# Patient Record
Sex: Female | Born: 1980
Health system: Southern US, Community
[De-identification: ages and names within clinical notes are randomized; demographics above are authoritative.]

## PROBLEM LIST (undated history)

## (undated) DIAGNOSIS — J45909 Unspecified asthma, uncomplicated: Secondary | ICD-10-CM

## (undated) DIAGNOSIS — I10 Essential (primary) hypertension: Secondary | ICD-10-CM

## (undated) DIAGNOSIS — E119 Type 2 diabetes mellitus without complications: Secondary | ICD-10-CM

## (undated) HISTORY — PX: TONSILLECTOMY: SUR1361

## (undated) HISTORY — PX: BARTHOLIN CYST MARSUPIALIZATION: SHX5383

---
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
## 2010-07-18 ENCOUNTER — Emergency Department (HOSPITAL_COMMUNITY): Payer: BC Managed Care – PPO

## 2010-07-18 ENCOUNTER — Emergency Department (HOSPITAL_COMMUNITY)
Admission: EM | Admit: 2010-07-18 | Discharge: 2010-07-18 | Disposition: A | Discharge status: Home / Self Care | Payer: BC Managed Care – PPO | Attending: Emergency Medicine | Admitting: Emergency Medicine

## 2010-07-18 DIAGNOSIS — E119 Type 2 diabetes mellitus without complications: Secondary | ICD-10-CM | POA: Insufficient documentation

## 2010-07-18 DIAGNOSIS — D802 Selective deficiency of immunoglobulin A [IgA]: Secondary | ICD-10-CM | POA: Insufficient documentation

## 2010-07-18 DIAGNOSIS — I10 Essential (primary) hypertension: Secondary | ICD-10-CM | POA: Insufficient documentation

## 2010-07-18 DIAGNOSIS — R112 Nausea with vomiting, unspecified: Secondary | ICD-10-CM | POA: Insufficient documentation

## 2010-07-18 DIAGNOSIS — R1013 Epigastric pain: Secondary | ICD-10-CM | POA: Insufficient documentation

## 2010-07-18 DIAGNOSIS — R5383 Other fatigue: Secondary | ICD-10-CM | POA: Insufficient documentation

## 2010-07-18 DIAGNOSIS — R000 Tachycardia, unspecified: Secondary | ICD-10-CM | POA: Insufficient documentation

## 2010-07-18 DIAGNOSIS — R5381 Other malaise: Secondary | ICD-10-CM | POA: Insufficient documentation

## 2010-07-18 DIAGNOSIS — Z79899 Other long term (current) drug therapy: Secondary | ICD-10-CM | POA: Insufficient documentation

## 2010-07-18 LAB — COMPREHENSIVE METABOLIC PANEL
ALT: 17 U/L (ref 0–35)
Alkaline Phosphatase: 46 U/L (ref 39–117)
CO2: 24 mEq/L (ref 19–32)
Chloride: 100 mEq/L (ref 96–112)
Glucose, Bld: 182 mg/dL — ABNORMAL HIGH (ref 70–99)
Potassium: 3.5 mEq/L (ref 3.5–5.1)
Sodium: 136 mEq/L (ref 135–145)
Total Bilirubin: 0.3 mg/dL (ref 0.3–1.2)
Total Protein: 7.1 g/dL (ref 6.0–8.3)

## 2010-07-18 LAB — URINALYSIS, ROUTINE W REFLEX MICROSCOPIC
Bilirubin Urine: NEGATIVE
Glucose, UA: NEGATIVE mg/dL
Hgb urine dipstick: NEGATIVE
Ketones, ur: 15 mg/dL — AB
Nitrite: NEGATIVE
Protein, ur: NEGATIVE mg/dL
Specific Gravity, Urine: 1.028 (ref 1.005–1.030)
Urobilinogen, UA: 0.2 mg/dL (ref 0.0–1.0)
pH: 5.5 (ref 5.0–8.0)

## 2010-07-18 LAB — DIFFERENTIAL
Basophils Absolute: 0 K/uL (ref 0.0–0.1)
Basophils Relative: 0 % (ref 0–1)
Eosinophils Absolute: 0 K/uL (ref 0.0–0.7)
Eosinophils Relative: 0 % (ref 0–5)
Lymphocytes Relative: 9 % — ABNORMAL LOW (ref 12–46)
Lymphs Abs: 1.1 10*3/uL (ref 0.7–4.0)
Monocytes Absolute: 0.5 10*3/uL (ref 0.1–1.0)
Monocytes Relative: 4 % (ref 3–12)
Neutro Abs: 10.3 10*3/uL — ABNORMAL HIGH (ref 1.7–7.7)
Neutrophils Relative %: 87 % — ABNORMAL HIGH (ref 43–77)

## 2010-07-18 LAB — COMPREHENSIVE METABOLIC PANEL WITH GFR
AST: 21 U/L (ref 0–37)
Albumin: 3.6 g/dL (ref 3.5–5.2)
BUN: 13 mg/dL (ref 6–23)
Calcium: 8.7 mg/dL (ref 8.4–10.5)
Creatinine, Ser: <0.47 mg/dL (ref 0.4–1.2)

## 2010-07-18 LAB — CBC
HCT: 38.2 % (ref 36.0–46.0)
Hemoglobin: 13.2 g/dL (ref 12.0–15.0)
MCH: 30.8 pg (ref 26.0–34.0)
MCHC: 34.6 g/dL (ref 30.0–36.0)
MCV: 89 fL (ref 78.0–100.0)
Platelets: 332 K/uL (ref 150–400)
RBC: 4.29 MIL/uL (ref 3.87–5.11)
RDW: 12.3 % (ref 11.5–15.5)
WBC: 11.8 K/uL — ABNORMAL HIGH (ref 4.0–10.5)

## 2010-07-18 LAB — LIPASE, BLOOD: Lipase: 36 U/L (ref 11–59)

## 2010-07-18 LAB — PREGNANCY, URINE: Preg Test, Ur: NEGATIVE

## 2013-09-30 ENCOUNTER — Other Ambulatory Visit (HOSPITAL_COMMUNITY): Payer: Self-pay | Admitting: Family Medicine

## 2013-09-30 DIAGNOSIS — R12 Heartburn: Secondary | ICD-10-CM

## 2013-09-30 DIAGNOSIS — R110 Nausea: Secondary | ICD-10-CM

## 2013-10-07 ENCOUNTER — Encounter (HOSPITAL_COMMUNITY)
Admission: RE | Admit: 2013-10-07 | Discharge: 2013-10-07 | Disposition: A | Discharge status: Home / Self Care | Payer: 59 | Source: Ambulatory Visit | Attending: Family Medicine | Admitting: Family Medicine

## 2013-10-07 DIAGNOSIS — R12 Heartburn: Secondary | ICD-10-CM

## 2013-10-07 DIAGNOSIS — R110 Nausea: Secondary | ICD-10-CM

## 2013-10-07 MED ORDER — SINCALIDE 5 MCG IJ SOLR
INTRAMUSCULAR | Status: AC
  Administered 2013-10-07: 5.68 ug via INTRAVENOUS
  Filled 2013-10-07: qty 10

## 2013-10-07 MED ORDER — SINCALIDE 5 MCG IJ SOLR
0.0200 ug/kg | Freq: Once | INTRAMUSCULAR | Status: AC
  Administered 2013-10-07: 5.68 ug via INTRAVENOUS

## 2013-10-07 MED ORDER — TECHNETIUM TC 99M MEBROFENIN IV KIT
5.0000 | PACK | Freq: Once | INTRAVENOUS | Status: AC | PRN
  Administered 2013-10-07: 5 via INTRAVENOUS

## 2014-03-29 ENCOUNTER — Ambulatory Visit: Payer: Self-pay | Admitting: Emergency Medicine

## 2014-03-29 LAB — RAPID STREP-A WITH REFLX: Micro Text Report: NEGATIVE

## 2014-04-01 LAB — BETA STREP CULTURE(ARMC)

## 2014-05-11 ENCOUNTER — Encounter (HOSPITAL_COMMUNITY): Payer: Self-pay

## 2014-05-11 ENCOUNTER — Encounter (HOSPITAL_COMMUNITY)
Admission: RE | Admit: 2014-05-11 | Discharge: 2014-05-11 | Disposition: A | Discharge status: Home / Self Care | Payer: 59 | Source: Ambulatory Visit | Attending: Obstetrics and Gynecology | Admitting: Obstetrics and Gynecology

## 2014-05-11 DIAGNOSIS — N809 Endometriosis, unspecified: Secondary | ICD-10-CM | POA: Insufficient documentation

## 2014-05-11 DIAGNOSIS — E282 Polycystic ovarian syndrome: Secondary | ICD-10-CM | POA: Diagnosis not present

## 2014-05-11 DIAGNOSIS — Z01818 Encounter for other preprocedural examination: Secondary | ICD-10-CM | POA: Diagnosis not present

## 2014-05-11 HISTORY — DX: Type 2 diabetes mellitus without complications: E11.9

## 2014-05-11 HISTORY — DX: Essential (primary) hypertension: I10

## 2014-05-11 HISTORY — DX: Unspecified asthma, uncomplicated: J45.909

## 2014-05-11 LAB — CBC
HEMATOCRIT: 37.7 % (ref 36.0–46.0)
HEMOGLOBIN: 13 g/dL (ref 12.0–15.0)
MCH: 31.2 pg (ref 26.0–34.0)
MCHC: 34.5 g/dL (ref 30.0–36.0)
MCV: 90.4 fL (ref 78.0–100.0)
Platelets: 328 10*3/uL (ref 150–400)
RBC: 4.17 MIL/uL (ref 3.87–5.11)
RDW: 12.9 % (ref 11.5–15.5)
WBC: 11.8 10*3/uL — AB (ref 4.0–10.5)

## 2014-05-11 LAB — BASIC METABOLIC PANEL
ANION GAP: 9 (ref 5–15)
BUN: 9 mg/dL (ref 6–23)
CALCIUM: 9.1 mg/dL (ref 8.4–10.5)
CHLORIDE: 99 mmol/L (ref 96–112)
CO2: 29 mmol/L (ref 19–32)
Creatinine, Ser: 0.31 mg/dL — ABNORMAL LOW (ref 0.50–1.10)
GFR calc Af Amer: >90 mL/min (ref >90)
GLUCOSE: 204 mg/dL — AB (ref 70–99)
POTASSIUM: 3.6 mmol/L (ref 3.5–5.1)
Sodium: 137 mmol/L (ref 135–145)

## 2014-05-11 NOTE — Patient Instructions (Addendum)
   Your procedure is scheduled on: MARCH 7 AT 730AM  Enter through the Main Entrance of [facility] at:6AM  Pick up the phone at the desk and dial [phone] and inform us of your arrival.  Please call this number if you have any problems the morning of surgery: [phone]  Remember: Do not eat food after midnight:MARCH 6 Do not drink clear liquids after: MARCH6 Take these medicines the morning of surgery with a SIP OF WATER: DO NOT TAKE DIABETES MEDS DAY OF SURGERY.. DO TAKE BLOOD PRESSURE DAY OF SURGERY.Marland Kitchen.TAKE PRIOLESC AM OF SURGERY  Do not wear jewelry, make-up, or FINGER nail polish No metal in your hair or on your body. Do not wear lotions, powders, perfumes.  You may wear deodorant.  Do not bring valuables to the hospital. Contacts, dentures or bridgework may not be worn into surgery.  Leave suitcase in the car. After Surgery it may be brought to your room. For patients being admitted to the hospital, checkout time is 11:00am the day of discharge.    Patients discharged on the day of surgery will not be allowed to drive home.

## 2014-05-12 NOTE — H&P (Signed)
  34 year old G 1 P 0 with chronic pelvic pain presents for TAH and BSO. Pain unrelieved with ocps, vesicare, antibiotics or physical therapy.  She has had a normal pelvic ultrasound except for PCOS. She complains of dyspareunia.    Past Medical History  Diagnosis Date  . Hypertension   . Diabetes mellitus without complication   . Asthma     HX BRONCHITIS   Past Surgical History  Procedure Laterality Date  . Bartholin cyst marsupialization    . Tonsillectomy     Review of patient's allergies indicates no known allergies.  There were no vitals taken for this visit. No results found for this or any previous visit (from the past 24 hour(s)).  History  Substance Use Topics  . Smoking status: Former Smoker    Quit date: 05/10/2009  . Smokeless tobacco: Never Used  . Alcohol Use: No   No family history on file. Afebrile VSS General alert and oriented Lung CTAB Car RRR Abdomen is soft and non tender Pelvic generalized pelvic pain tenderness. Tenderness with movement of uterus  IMPRESSION: Chronic pelvic pain  PLAN: TAH and BSO Risks reviewed Consent signed

## 2014-05-15 MED ORDER — CEFAZOLIN SODIUM 10 G IJ SOLR
3.0000 g | INTRAMUSCULAR | Status: AC
  Administered 2014-05-16: 3 g via INTRAVENOUS
  Filled 2014-05-15: qty 3000

## 2014-05-16 ENCOUNTER — Encounter (HOSPITAL_COMMUNITY): Payer: Self-pay | Admitting: *Deleted

## 2014-05-16 ENCOUNTER — Inpatient Hospital Stay (HOSPITAL_COMMUNITY): Payer: 59 | Admitting: Anesthesiology

## 2014-05-16 ENCOUNTER — Encounter (HOSPITAL_COMMUNITY)
Admission: RE | Disposition: A | Discharge status: Home / Self Care | Payer: Self-pay | Source: Ambulatory Visit | Attending: Obstetrics and Gynecology

## 2014-05-16 ENCOUNTER — Inpatient Hospital Stay (HOSPITAL_COMMUNITY)
Admission: RE | Admit: 2014-05-16 | Discharge: 2014-05-17 | DRG: 743 | Disposition: A | Discharge status: Home / Self Care | Payer: 59 | Source: Ambulatory Visit | Attending: Obstetrics and Gynecology | Admitting: Obstetrics and Gynecology

## 2014-05-16 DIAGNOSIS — G8929 Other chronic pain: Secondary | ICD-10-CM | POA: Diagnosis present

## 2014-05-16 DIAGNOSIS — I10 Essential (primary) hypertension: Secondary | ICD-10-CM | POA: Diagnosis present

## 2014-05-16 DIAGNOSIS — E282 Polycystic ovarian syndrome: Principal | ICD-10-CM | POA: Diagnosis present

## 2014-05-16 DIAGNOSIS — Z87891 Personal history of nicotine dependence: Secondary | ICD-10-CM | POA: Diagnosis not present

## 2014-05-16 DIAGNOSIS — J45909 Unspecified asthma, uncomplicated: Secondary | ICD-10-CM | POA: Diagnosis present

## 2014-05-16 DIAGNOSIS — N809 Endometriosis, unspecified: Secondary | ICD-10-CM | POA: Diagnosis present

## 2014-05-16 DIAGNOSIS — E119 Type 2 diabetes mellitus without complications: Secondary | ICD-10-CM | POA: Diagnosis present

## 2014-05-16 DIAGNOSIS — Z90710 Acquired absence of both cervix and uterus: Secondary | ICD-10-CM

## 2014-05-16 DIAGNOSIS — Z90722 Acquired absence of ovaries, bilateral: Secondary | ICD-10-CM

## 2014-05-16 DIAGNOSIS — R102 Pelvic and perineal pain: Secondary | ICD-10-CM | POA: Diagnosis present

## 2014-05-16 DIAGNOSIS — Z9079 Acquired absence of other genital organ(s): Secondary | ICD-10-CM

## 2014-05-16 HISTORY — PX: SALPINGOOPHORECTOMY: SHX82

## 2014-05-16 HISTORY — PX: ABDOMINAL HYSTERECTOMY: SHX81

## 2014-05-16 LAB — GLUCOSE, CAPILLARY
GLUCOSE-CAPILLARY: 234 mg/dL — AB (ref 70–99)
GLUCOSE-CAPILLARY: 272 mg/dL — AB (ref 70–99)
GLUCOSE-CAPILLARY: 283 mg/dL — AB (ref 70–99)
Glucose-Capillary: 282 mg/dL — ABNORMAL HIGH (ref 70–99)
Glucose-Capillary: 244 mg/dL — ABNORMAL HIGH (ref 70–99)
Glucose-Capillary: 184 mg/dL — ABNORMAL HIGH (ref 70–99)

## 2014-05-16 LAB — PREGNANCY, URINE: Preg Test, Ur: NEGATIVE

## 2014-05-16 SURGERY — HYSTERECTOMY, ABDOMINAL
Anesthesia: General | Site: Abdomen

## 2014-05-16 MED ORDER — MIDAZOLAM HCL 2 MG/2ML IJ SOLN
INTRAMUSCULAR | Status: AC
  Filled 2014-05-16: qty 2

## 2014-05-16 MED ORDER — ONDANSETRON HCL 4 MG/2ML IJ SOLN: 4.0000 mg | Freq: Four times a day (QID) | INTRAMUSCULAR | Status: DC | PRN

## 2014-05-16 MED ORDER — LACTATED RINGERS IV SOLN
INTRAVENOUS | Status: DC
  Administered 2014-05-16 (×2): via INTRAVENOUS

## 2014-05-16 MED ORDER — METFORMIN HCL ER 500 MG PO TB24
1000.0000 mg | ORAL_TABLET | Freq: Every day | ORAL | Status: DC
  Administered 2014-05-17: 1000 mg via ORAL
  Filled 2014-05-16 (×2): qty 2

## 2014-05-16 MED ORDER — IBUPROFEN 600 MG PO TABS
600.0000 mg | ORAL_TABLET | Freq: Four times a day (QID) | ORAL | Status: DC | PRN
  Administered 2014-05-17 (×2): 600 mg via ORAL
  Filled 2014-05-16 (×2): qty 1

## 2014-05-16 MED ORDER — LACTATED RINGERS IV SOLN: INTRAVENOUS | Status: DC

## 2014-05-16 MED ORDER — HYDROMORPHONE HCL 1 MG/ML IJ SOLN
INTRAMUSCULAR | Status: AC
  Administered 2014-05-16: 0.5 mg via INTRAVENOUS
  Filled 2014-05-16: qty 1

## 2014-05-16 MED ORDER — LISINOPRIL 10 MG PO TABS
10.0000 mg | ORAL_TABLET | Freq: Every day | ORAL | Status: DC
  Administered 2014-05-17: 10 mg via ORAL
  Filled 2014-05-16 (×2): qty 1

## 2014-05-16 MED ORDER — FLUTICASONE PROPIONATE 50 MCG/ACT NA SUSP
2.0000 | Freq: Every day | NASAL | Status: DC
  Administered 2014-05-16 – 2014-05-17 (×2): 2 via NASAL
  Filled 2014-05-16: qty 16

## 2014-05-16 MED ORDER — GLYCOPYRROLATE 0.2 MG/ML IJ SOLN
INTRAMUSCULAR | Status: DC | PRN
  Administered 2014-05-16: 0.2 mg via INTRAVENOUS
  Administered 2014-05-16: .8 mg via INTRAVENOUS

## 2014-05-16 MED ORDER — KETOROLAC TROMETHAMINE 30 MG/ML IJ SOLN
INTRAMUSCULAR | Status: AC
  Filled 2014-05-16: qty 1

## 2014-05-16 MED ORDER — HYOSCYAMINE SULFATE 0.125 MG SL SUBL
0.1250 mg | SUBLINGUAL_TABLET | Freq: Every day | SUBLINGUAL | Status: DC
  Administered 2014-05-16 – 2014-05-17 (×2): 0.125 mg via SUBLINGUAL
  Filled 2014-05-16 (×3): qty 1

## 2014-05-16 MED ORDER — FENTANYL CITRATE 0.05 MG/ML IJ SOLN
INTRAMUSCULAR | Status: DC | PRN
  Administered 2014-05-16: 100 ug via INTRAVENOUS
  Administered 2014-05-16: 150 ug via INTRAVENOUS
  Administered 2014-05-16: 100 ug via INTRAVENOUS

## 2014-05-16 MED ORDER — SITAGLIP PHOS-METFORMIN HCL ER 100-1000 MG PO TB24: 1.0000 | ORAL_TABLET | Freq: Every day | ORAL | Status: DC

## 2014-05-16 MED ORDER — SODIUM CHLORIDE 0.9 % IJ SOLN: 9.0000 mL | INTRAMUSCULAR | Status: DC | PRN

## 2014-05-16 MED ORDER — MENTHOL 3 MG MT LOZG: 1.0000 | LOZENGE | OROMUCOSAL | Status: DC | PRN

## 2014-05-16 MED ORDER — NEOSTIGMINE METHYLSULFATE 10 MG/10ML IV SOLN
INTRAVENOUS | Status: DC | PRN
  Administered 2014-05-16: 5 mg via INTRAVENOUS

## 2014-05-16 MED ORDER — PROMETHAZINE HCL 25 MG/ML IJ SOLN: 6.2500 mg | INTRAMUSCULAR | Status: DC | PRN

## 2014-05-16 MED ORDER — ACETAMINOPHEN 160 MG/5ML PO SOLN: 325.0000 mg | ORAL | Status: DC | PRN

## 2014-05-16 MED ORDER — BUPIVACAINE LIPOSOME 1.3 % IJ SUSP
20.0000 mL | Freq: Once | INTRAMUSCULAR | Status: DC
  Filled 2014-05-16: qty 20

## 2014-05-16 MED ORDER — NEOSTIGMINE METHYLSULFATE 10 MG/10ML IV SOLN
INTRAVENOUS | Status: AC
  Filled 2014-05-16: qty 1

## 2014-05-16 MED ORDER — LACTATED RINGERS IV SOLN
INTRAVENOUS | Status: DC
  Administered 2014-05-16 (×2): via INTRAVENOUS

## 2014-05-16 MED ORDER — HYDROMORPHONE HCL 1 MG/ML IJ SOLN
INTRAMUSCULAR | Status: DC | PRN
  Administered 2014-05-16 (×2): 1 mg via INTRAVENOUS

## 2014-05-16 MED ORDER — MEPERIDINE HCL 25 MG/ML IJ SOLN: 6.2500 mg | INTRAMUSCULAR | Status: DC | PRN

## 2014-05-16 MED ORDER — HYDROCHLOROTHIAZIDE 12.5 MG PO CAPS
12.5000 mg | ORAL_CAPSULE | Freq: Every day | ORAL | Status: DC
  Administered 2014-05-17: 12.5 mg via ORAL
  Filled 2014-05-16 (×2): qty 1

## 2014-05-16 MED ORDER — SCOPOLAMINE 1 MG/3DAYS TD PT72
MEDICATED_PATCH | TRANSDERMAL | Status: AC
  Administered 2014-05-16: 1.5 mg via TRANSDERMAL
  Filled 2014-05-16: qty 1

## 2014-05-16 MED ORDER — HYDROMORPHONE HCL 1 MG/ML IJ SOLN
0.2500 mg | INTRAMUSCULAR | Status: DC | PRN
  Administered 2014-05-16 (×2): 0.5 mg via INTRAVENOUS

## 2014-05-16 MED ORDER — ONDANSETRON HCL 4 MG/2ML IJ SOLN
INTRAMUSCULAR | Status: AC
  Filled 2014-05-16: qty 2

## 2014-05-16 MED ORDER — BUPIVACAINE HCL (PF) 0.25 % IJ SOLN
INTRAMUSCULAR | Status: AC
  Filled 2014-05-16: qty 30

## 2014-05-16 MED ORDER — HYDROMORPHONE 0.3 MG/ML IV SOLN
INTRAVENOUS | Status: DC
  Administered 2014-05-16: 5.33 mL via INTRAVENOUS
  Administered 2014-05-16: 3.59 mg via INTRAVENOUS
  Administered 2014-05-16: 12:00:00 via INTRAVENOUS
  Administered 2014-05-16: 4 mL via INTRAVENOUS
  Filled 2014-05-16: qty 25

## 2014-05-16 MED ORDER — ROCURONIUM BROMIDE 100 MG/10ML IV SOLN
INTRAVENOUS | Status: AC
  Filled 2014-05-16: qty 1

## 2014-05-16 MED ORDER — INSULIN ASPART 100 UNIT/ML ~~LOC~~ SOLN
0.0000 [IU] | Freq: Three times a day (TID) | SUBCUTANEOUS | Status: DC
  Administered 2014-05-16 – 2014-05-17 (×3): 7 [IU] via SUBCUTANEOUS

## 2014-05-16 MED ORDER — LIDOCAINE HCL (CARDIAC) 20 MG/ML IV SOLN
INTRAVENOUS | Status: DC | PRN
  Administered 2014-05-16: 100 mg via INTRAVENOUS

## 2014-05-16 MED ORDER — BUPIVACAINE HCL (PF) 0.25 % IJ SOLN
INTRAMUSCULAR | Status: DC | PRN
  Administered 2014-05-16: 20 mL

## 2014-05-16 MED ORDER — INSULIN ASPART 100 UNIT/ML ~~LOC~~ SOLN
1.0000 [IU] | Freq: Once | SUBCUTANEOUS | Status: AC
  Administered 2014-05-16: 1 [IU] via SUBCUTANEOUS

## 2014-05-16 MED ORDER — FENTANYL CITRATE 0.05 MG/ML IJ SOLN
INTRAMUSCULAR | Status: AC
  Filled 2014-05-16: qty 5

## 2014-05-16 MED ORDER — 0.9 % SODIUM CHLORIDE (POUR BTL) OPTIME
TOPICAL | Status: DC | PRN
  Administered 2014-05-16: 2000 mL

## 2014-05-16 MED ORDER — SCOPOLAMINE 1 MG/3DAYS TD PT72
1.0000 | MEDICATED_PATCH | Freq: Once | TRANSDERMAL | Status: DC
  Administered 2014-05-16: 1.5 mg via TRANSDERMAL

## 2014-05-16 MED ORDER — NALOXONE HCL 0.4 MG/ML IJ SOLN: 0.4000 mg | INTRAMUSCULAR | Status: DC | PRN

## 2014-05-16 MED ORDER — ROCURONIUM BROMIDE 100 MG/10ML IV SOLN
INTRAVENOUS | Status: DC | PRN
  Administered 2014-05-16 (×2): 10 mg via INTRAVENOUS
  Administered 2014-05-16: 50 mg via INTRAVENOUS

## 2014-05-16 MED ORDER — SODIUM CHLORIDE 0.9 % IJ SOLN
INTRAMUSCULAR | Status: AC
  Filled 2014-05-16: qty 50

## 2014-05-16 MED ORDER — SUCCINYLCHOLINE CHLORIDE 20 MG/ML IJ SOLN
INTRAMUSCULAR | Status: AC
  Filled 2014-05-16: qty 1

## 2014-05-16 MED ORDER — ONDANSETRON HCL 4 MG/2ML IJ SOLN
INTRAMUSCULAR | Status: DC | PRN
  Administered 2014-05-16: 4 mg via INTRAVENOUS

## 2014-05-16 MED ORDER — KETOROLAC TROMETHAMINE 30 MG/ML IJ SOLN
30.0000 mg | Freq: Once | INTRAMUSCULAR | Status: AC
  Administered 2014-05-16: 30 mg via INTRAVENOUS

## 2014-05-16 MED ORDER — DIPHENHYDRAMINE HCL 50 MG/ML IJ SOLN: 12.5000 mg | Freq: Four times a day (QID) | INTRAMUSCULAR | Status: DC | PRN

## 2014-05-16 MED ORDER — PANTOPRAZOLE SODIUM 40 MG PO TBEC
40.0000 mg | DELAYED_RELEASE_TABLET | Freq: Every day | ORAL | Status: DC
  Administered 2014-05-17: 40 mg via ORAL
  Filled 2014-05-16: qty 1

## 2014-05-16 MED ORDER — DEXAMETHASONE SODIUM PHOSPHATE 10 MG/ML IJ SOLN
INTRAMUSCULAR | Status: AC
  Filled 2014-05-16: qty 1

## 2014-05-16 MED ORDER — SUCCINYLCHOLINE CHLORIDE 20 MG/ML IJ SOLN
INTRAMUSCULAR | Status: DC | PRN
  Administered 2014-05-16: 120 mg via INTRAVENOUS

## 2014-05-16 MED ORDER — PROPOFOL 10 MG/ML IV BOLUS
INTRAVENOUS | Status: DC | PRN
  Administered 2014-05-16: 300 mg via INTRAVENOUS
  Administered 2014-05-16: 100 mg via INTRAVENOUS

## 2014-05-16 MED ORDER — PROPOFOL 10 MG/ML IV BOLUS
INTRAVENOUS | Status: AC
  Filled 2014-05-16: qty 20

## 2014-05-16 MED ORDER — KETOROLAC TROMETHAMINE 30 MG/ML IJ SOLN: 30.0000 mg | Freq: Once | INTRAMUSCULAR | Status: DC | PRN

## 2014-05-16 MED ORDER — HYDROMORPHONE HCL 1 MG/ML IJ SOLN
INTRAMUSCULAR | Status: AC
  Filled 2014-05-16: qty 1

## 2014-05-16 MED ORDER — TRAMADOL HCL 50 MG PO TABS
50.0000 mg | ORAL_TABLET | Freq: Four times a day (QID) | ORAL | Status: DC | PRN
  Administered 2014-05-16 – 2014-05-17 (×2): 50 mg via ORAL
  Filled 2014-05-16 (×2): qty 1

## 2014-05-16 MED ORDER — ACETAMINOPHEN 325 MG PO TABS: 325.0000 mg | ORAL_TABLET | ORAL | Status: DC | PRN

## 2014-05-16 MED ORDER — FENTANYL CITRATE 0.05 MG/ML IJ SOLN
INTRAMUSCULAR | Status: AC
  Filled 2014-05-16: qty 2

## 2014-05-16 MED ORDER — GLIPIZIDE ER 10 MG PO TB24
10.0000 mg | ORAL_TABLET | Freq: Every day | ORAL | Status: DC
  Administered 2014-05-17: 10 mg via ORAL
  Filled 2014-05-16 (×2): qty 1

## 2014-05-16 MED ORDER — LIDOCAINE HCL (CARDIAC) 20 MG/ML IV SOLN
INTRAVENOUS | Status: AC
  Filled 2014-05-16: qty 5

## 2014-05-16 MED ORDER — SERTRALINE HCL 50 MG PO TABS
50.0000 mg | ORAL_TABLET | Freq: Every day | ORAL | Status: DC
  Administered 2014-05-16 – 2014-05-17 (×2): 50 mg via ORAL
  Filled 2014-05-16 (×3): qty 1

## 2014-05-16 MED ORDER — BUDESONIDE-FORMOTEROL FUMARATE 80-4.5 MCG/ACT IN AERO: 2.0000 | INHALATION_SPRAY | RESPIRATORY_TRACT | Status: DC | PRN

## 2014-05-16 MED ORDER — INSULIN ASPART 100 UNIT/ML ~~LOC~~ SOLN
6.0000 [IU] | Freq: Three times a day (TID) | SUBCUTANEOUS | Status: DC
Start: 2014-05-16 — End: 2014-05-17
  Administered 2014-05-16 – 2014-05-17 (×3): 6 [IU] via SUBCUTANEOUS

## 2014-05-16 MED ORDER — DIPHENHYDRAMINE HCL 12.5 MG/5ML PO ELIX: 12.5000 mg | ORAL_SOLUTION | Freq: Four times a day (QID) | ORAL | Status: DC | PRN

## 2014-05-16 MED ORDER — LISINOPRIL-HYDROCHLOROTHIAZIDE 10-12.5 MG PO TABS: 1.0000 | ORAL_TABLET | Freq: Every day | ORAL | Status: DC

## 2014-05-16 MED ORDER — MIDAZOLAM HCL 2 MG/2ML IJ SOLN: 0.5000 mg | Freq: Once | INTRAMUSCULAR | Status: DC | PRN

## 2014-05-16 MED ORDER — LINAGLIPTIN 5 MG PO TABS
5.0000 mg | ORAL_TABLET | Freq: Every day | ORAL | Status: DC
  Administered 2014-05-17: 5 mg via ORAL
  Filled 2014-05-16 (×2): qty 1

## 2014-05-16 SURGICAL SUPPLY — 41 items
CANISTER SUCT 3000ML (MISCELLANEOUS) ×4 IMPLANT
CLOTH BEACON ORANGE TIMEOUT ST (SAFETY) ×4 IMPLANT
DECANTER SPIKE VIAL GLASS SM (MISCELLANEOUS) IMPLANT
DRAPE CESAREAN BIRTH W POUCH (DRAPES) ×4 IMPLANT
DRAPE WARM FLUID 44X44 (DRAPE) ×4 IMPLANT
DRSG OPSITE POSTOP 4X10 (GAUZE/BANDAGES/DRESSINGS) ×4 IMPLANT
DURAPREP 26ML APPLICATOR (WOUND CARE) ×4 IMPLANT
ELECT BLADE 6.5 EXT (BLADE) ×4 IMPLANT
GAUZE SPONGE 4X4 16PLY XRAY LF (GAUZE/BANDAGES/DRESSINGS) IMPLANT
GLOVE BIO SURGEON STRL SZ 6.5 (GLOVE) ×3 IMPLANT
GLOVE BIO SURGEONS STRL SZ 6.5 (GLOVE) ×1
GLOVE BIOGEL PI IND STRL 7.0 (GLOVE) ×6 IMPLANT
GLOVE BIOGEL PI INDICATOR 7.0 (GLOVE) ×6
GOWN STRL REUS W/TWL LRG LVL3 (GOWN DISPOSABLE) ×12 IMPLANT
HEMOSTAT SURGICEL 2X3 (HEMOSTASIS) ×4 IMPLANT
LIQUID BAND (GAUZE/BANDAGES/DRESSINGS) ×4 IMPLANT
NEEDLE HYPO 22GX1.5 SAFETY (NEEDLE) ×4 IMPLANT
NS IRRIG 1000ML POUR BTL (IV SOLUTION) ×4 IMPLANT
PACK ABDOMINAL GYN (CUSTOM PROCEDURE TRAY) ×4 IMPLANT
PAD OB MATERNITY 4.3X12.25 (PERSONAL CARE ITEMS) ×4 IMPLANT
PROTECTOR NERVE ULNAR (MISCELLANEOUS) ×4 IMPLANT
SEPRAFILM MEMBRANE 5X6 (MISCELLANEOUS) IMPLANT
SPONGE LAP 18X18 X RAY DECT (DISPOSABLE) IMPLANT
STAPLER VISISTAT 35W (STAPLE) IMPLANT
SUT PDS AB 0 CT 36 (SUTURE) IMPLANT
SUT PDS AB 0 CTX 60 (SUTURE) IMPLANT
SUT PLAIN 2 0 XLH (SUTURE) IMPLANT
SUT VIC AB 0 CT1 18XCR BRD8 (SUTURE) ×18 IMPLANT
SUT VIC AB 0 CT1 27 (SUTURE) ×10
SUT VIC AB 0 CT1 27XBRD ANBCTR (SUTURE) ×8 IMPLANT
SUT VIC AB 0 CT1 27XCR 8 STRN (SUTURE) ×2 IMPLANT
SUT VIC AB 0 CT1 8-18 (SUTURE) ×18
SUT VIC AB 3-0 PS1 18 (SUTURE)
SUT VIC AB 3-0 PS1 18X BRD (SUTURE) IMPLANT
SUT VIC AB 4-0 KS 27 (SUTURE) ×4 IMPLANT
SUT VICRYL 0 TIES 12 18 (SUTURE) ×4 IMPLANT
SYR CONTROL 10ML LL (SYRINGE) ×4 IMPLANT
SYRINGE 10CC LL (SYRINGE) ×4 IMPLANT
TOWEL OR 17X24 6PK STRL BLUE (TOWEL DISPOSABLE) ×8 IMPLANT
TRAY FOLEY CATH 14FR (SET/KITS/TRAYS/PACK) ×4 IMPLANT
WATER STERILE IRR 1000ML POUR (IV SOLUTION) ×4 IMPLANT

## 2014-05-16 NOTE — Anesthesia Procedure Notes (Signed)
Procedure Name: Intubation Date/Time: 05/16/2014 7:42 AM Performed by: Graciela HusbandsFUSSELL, Helena Sardo O Pre-anesthesia Checklist: Patient identified, Timeout performed, Emergency Drugs available, Suction available and Patient being monitored Patient Re-evaluated:Patient Re-evaluated prior to inductionOxygen Delivery Method: Circle system utilized Preoxygenation: Pre-oxygenation with 100% oxygen Intubation Type: IV induction Ventilation: Oral airway inserted - appropriate to patient size and Mask ventilation without difficulty Laryngoscope size: LO PRO 3. Grade View: Grade II Tube size: 7.0 mm Number of attempts: 1 Airway Equipment and Method: Patient positioned with wedge pillow,  Stylet and Video-laryngoscopy Placement Confirmation: ETT inserted through vocal cords under direct vision,  positive ETCO2 and breath sounds checked- equal and bilateral Secured at: 21 cm Tube secured with: Tape Dental Injury: Teeth and Oropharynx as per pre-operative assessment  Difficulty Due To: Difficulty was anticipated, Difficult Airway- due to anterior larynx and Difficult Airway- due to limited oral opening

## 2014-05-16 NOTE — Transfer of Care (Signed)
Immediate Anesthesia Transfer of Care Note  Patient: Madison Kirby  Procedure(s) Performed: Procedure(s): HYSTERECTOMY ABDOMINAL (N/A) SALPINGO OOPHORECTOMY (Bilateral)  Patient Location: PACU  Anesthesia Type:General  Level of Consciousness: awake, alert  and oriented  Airway & Oxygen Therapy: Patient Spontanous Breathing and Patient connected to nasal cannula oxygen  Post-op Assessment: Report given to RN and Post -op Vital signs reviewed and stable  Post vital signs: Reviewed and stable  Last Vitals:  Filed Vitals:   05/16/14 0605  BP: 143/85  Pulse: 77  Temp: 36.8 C  Resp: 20    Complications: No apparent anesthesia complications

## 2014-05-16 NOTE — Progress Notes (Signed)
H and P on the chart. No significant changes Proceed with TAH and BSO Consent signed

## 2014-05-16 NOTE — Anesthesia Postprocedure Evaluation (Signed)
  Anesthesia Post Note  Patient: Madison Kirby  Procedure(s) Performed: Procedure(s) (LRB): HYSTERECTOMY ABDOMINAL (N/A) SALPINGO OOPHORECTOMY (Bilateral)  Anesthesia type: GA  Patient location: PACU  Post pain: Pain level controlled  Post assessment: Post-op Vital signs reviewed  Last Vitals:  Filed Vitals:   05/16/14 0940  BP: 131/60  Pulse: 104  Temp: 37.2 C  Resp: 15    Post vital signs: Reviewed  Level of consciousness: sedated  Complications: No apparent anesthesia complications

## 2014-05-16 NOTE — Anesthesia Postprocedure Evaluation (Signed)
Anesthesia Post Note  Patient: Madison Kirby  Procedure(s) Performed: Procedure(s) (LRB): HYSTERECTOMY ABDOMINAL (N/A) SALPINGO OOPHORECTOMY (Bilateral)  Anesthesia type: General  Patient location: Mother/Baby  Post pain: Pain level controlled  Post assessment: Post-op Vital signs reviewed  Last Vitals:  Filed Vitals:   05/16/14 1230  BP: 115/59  Pulse: 88  Temp: 36.8 C  Resp: 18    Post vital signs: Reviewed  Level of consciousness: awake and alert   Complications: No apparent anesthesia complications

## 2014-05-16 NOTE — Brief Op Note (Signed)
05/16/2014  9:25 AM  PATIENT:  Madison Kirby  34 y.o. female  PRE-OPERATIVE DIAGNOSIS:  PCOS, endometriosis  POST-OPERATIVE DIAGNOSIS:  chronic pelvic pain, endometriosis  PROCEDURE:  Procedure(s): HYSTERECTOMY ABDOMINAL (N/A) SALPINGO OOPHORECTOMY (Bilateral)  SURGEON:  Surgeon(s) and Role:    * Jeani HawkingMichelle L Luke Falero, MD - Primary    * Meriel Picaichard M Holland, MD - Assisting  PHYSICIAN ASSISTANT:   ASSISTANTS: none   ANESTHESIA:   local and general  EBL:  Total I/O In: 1000 [I.V.:1000] Out: 700 [Urine:300; Blood:400]  BLOOD ADMINISTERED:none  DRAINS: Urinary Catheter (Foley)   LOCAL MEDICATIONS USED:  MARCAINE     SPECIMEN:  Source of Specimen:  uterus, cervix, tubes and ovaries  DISPOSITION OF SPECIMEN:  PATHOLOGY  COUNTS:  YES  TOURNIQUET:  * No tourniquets in log *  DICTATION: .Other Dictation: Dictation Number [id number]  PLAN OF CARE: Admit to inpatient   PATIENT DISPOSITION:  PACU - hemodynamically stable.   Delay start of Pharmacological VTE agent (>24hrs) due to surgical blood loss or risk of bleeding: not applicable

## 2014-05-16 NOTE — Addendum Note (Signed)
Addendum  created 05/16/14 1237 by Jhonnie GarnerBeth M Jerzi Tigert, CRNA   Modules edited: Notes Section   Notes Section:  File: [id number]

## 2014-05-16 NOTE — Anesthesia Preprocedure Evaluation (Signed)
Anesthesia Evaluation  Patient identified by MRN, date of birth, ID band Patient awake    Reviewed: Allergy & Precautions, NPO status , Patient's Chart, lab work & pertinent test results  History of Anesthesia Complications Negative for: history of anesthetic complications  Airway Mallampati: III  TM Distance: >3 FB Neck ROM: Full  Mouth opening: Limited Mouth Opening  Dental no notable dental hx. (+) Dental Advisory Given   Pulmonary asthma , former smoker,  breath sounds clear to auscultation  Pulmonary exam normal       Cardiovascular hypertension, Pt. on medications Rhythm:Regular Rate:Normal     Neuro/Psych negative neurological ROS  negative psych ROS   GI/Hepatic negative GI ROS, Neg liver ROS,   Endo/Other  diabetes, Type 2, Oral Hypoglycemic Agents  Renal/GU negative Renal ROS  negative genitourinary   Musculoskeletal negative musculoskeletal ROS (+)   Abdominal   Peds negative pediatric ROS (+)  Hematology negative hematology ROS (+)   Anesthesia Other Findings   Reproductive/Obstetrics negative OB ROS                             Anesthesia Physical Anesthesia Plan  ASA: III  Anesthesia Plan: General   Post-op Pain Management:    Induction: Intravenous  Airway Management Planned: Oral ETT  Additional Equipment:   Intra-op Plan:   Post-operative Plan:   Informed Consent: I have reviewed the patients History and Physical, chart, labs and discussed the procedure including the risks, benefits and alternatives for the proposed anesthesia with the patient or authorized representative who has indicated his/her understanding and acceptance.   Dental advisory given  Plan Discussed with:   Anesthesia Plan Comments:         Anesthesia Quick Evaluation

## 2014-05-17 ENCOUNTER — Encounter (HOSPITAL_COMMUNITY): Payer: Self-pay | Admitting: Obstetrics and Gynecology

## 2014-05-17 LAB — BASIC METABOLIC PANEL
ANION GAP: 7 (ref 5–15)
BUN: <5 mg/dL — ABNORMAL LOW (ref 6–23)
CO2: 29 mmol/L (ref 19–32)
Calcium: 8.4 mg/dL (ref 8.4–10.5)
Chloride: 101 mmol/L (ref 96–112)
Creatinine, Ser: 0.4 mg/dL — ABNORMAL LOW (ref 0.50–1.10)
Glucose, Bld: 227 mg/dL — ABNORMAL HIGH (ref 70–99)
POTASSIUM: 3.7 mmol/L (ref 3.5–5.1)
Sodium: 137 mmol/L (ref 135–145)

## 2014-05-17 LAB — CBC
HCT: 36 % (ref 36.0–46.0)
HEMOGLOBIN: 12.3 g/dL (ref 12.0–15.0)
MCH: 31.2 pg (ref 26.0–34.0)
MCHC: 34.2 g/dL (ref 30.0–36.0)
MCV: 91.4 fL (ref 78.0–100.0)
Platelets: 288 10*3/uL (ref 150–400)
RBC: 3.94 MIL/uL (ref 3.87–5.11)
RDW: 12.8 % (ref 11.5–15.5)
WBC: 11.5 10*3/uL — ABNORMAL HIGH (ref 4.0–10.5)

## 2014-05-17 LAB — GLUCOSE, CAPILLARY
Glucose-Capillary: 211 mg/dL — ABNORMAL HIGH (ref 70–99)
Glucose-Capillary: 218 mg/dL — ABNORMAL HIGH (ref 70–99)

## 2014-05-17 MED ORDER — OXYCODONE-ACETAMINOPHEN 5-325 MG PO TABS: 1.0000 | ORAL_TABLET | ORAL | Status: AC | PRN

## 2014-05-17 MED ORDER — OXYCODONE-ACETAMINOPHEN 5-325 MG PO TABS: 1.0000 | ORAL_TABLET | ORAL | Status: DC | PRN

## 2014-05-17 MED ORDER — IBUPROFEN 600 MG PO TABS: 600.0000 mg | ORAL_TABLET | Freq: Four times a day (QID) | ORAL | Status: AC | PRN

## 2014-05-17 NOTE — Op Note (Signed)
NAMEAILI, CASILLAS                 ACCOUNT NO.:  [id number]  MEDICAL RECORD NO.:  [id number]  LOCATION:  9319                          FACILITY:  [facility]  PHYSICIAN:  Rosette Bellavance L. Sederick Jacobsen, M.D.DATE OF BIRTH:  Aug 28, 1980  DATE OF PROCEDURE:  05/16/2014 DATE OF DISCHARGE:                              OPERATIVE REPORT   PREOPERATIVE DIAGNOSIS:  Polycystic ovarian syndrome and chronic pelvic pain.  POSTOPERATIVE DIAGNOSIS:  Polycystic ovarian syndrome and chronic pelvic pain.  PROCEDURE:  TAH and BSO.  SURGEON:  Mylon Mabey L. Vincente Poli, M.D.  ANESTHESIA:  General with local Marcaine.  EBL:  400 mL.  DRAINS:  Foley.  PATHOLOGY:  Uterus, cervix, tubes, and ovaries, sent to pathology.  PROCEDURE:  The patient was taken to the operating room.  She had been consented about the risk associated with the procedure.  She was intubated.  Please see anesthesia note because she was a difficult intubation.  She was then prepped and draped in the usual sterile fashion.  Foley catheter was inserted.  Time-out was performed.  A low transverse incision was made, carried down to the fascia.  Fascia was scored to the midline extended laterally.  The rectus muscles were separated in the midline.  The peritoneum was entered and the peritoneum was stretched bluntly.  Because of the patient's body habitus, we placed her in Trendelenburg position and placed the large and small bowel in the upper abdomen.  I placed a self-retaining retractor in the abdominal cavity.  The uterus is small.  The ovaries were very large consistent with PCOS.  We grasped the ovary to the triple pedicle on either side using Kelly clamps and elevated the uterus.  We then suture ligated the round ligament on the right side and carried that down anteriorly.  We then created a avascular window beneath the ovary, identified the infundibulopelvic ligament on the right side and placed a Kelly clamp across that with careful attention to  avoid irritability.  The pedicle was clamped, cut, and suture ligated using 0 Vicryl suture and also tied with a free tie of 0 Vicryl suture.  This was done on the right side and then on the left side in an identical fashion.  The uterus was elevated. The uterus was very deep in the pelvis because of the patient's body habitus.  We then skeletonized the uterine artery on either side, developed our bladder flap anteriorly with Metzenbaum scissors carefully and placed curved Heaney clamps across the uterine artery at the level of the internal os.  The pedicle was clamped, cut, and suture ligated using 0 Vicryl suture.  We then carefully walked our way down the cervix, clamping the uterosacral cardinal ligament complexes on either side staying snug beside the cervix using straight clamps.  Each pedicle was clamped, cut, and suture ligated using 0 Vicryl suture.  The cervix was short.  We were able to then place curved Heaney clamps just beneath the cervix.  The pedicle was clamped.  The specimen was removed, identified as cervix, uterus, fallopian tubes, and bilateral ovaries. The angle stitches were placed using 0 Vicryl suture.  The remainder of the cuff was closed using 0 Vicryl suture.  There was a little bit of bleeding posteriorly just behind the cuff.  We then closed that with a figure-of-eight using 0 Vicryl suture with diffuse area slightly. Hemostasis was very good.  All pedicles were checked.  Hemostasis was excellent.  One piece of Surgicel was placed at the vaginal cuff.  All instruments and laparotomy pads were removed from the abdominal cavity. The peritoneum was closed using 0 Vicryl.  The fascia was closed using 0 Vicryl starting each corner and meeting in the midline.  After irrigation, the skin was closed with a 4-0 Vicryl on a Keith needle. Marcaine was infiltrated.  Dermabond was applied and a honeycomb dressing was applied.  All sponge, lap, and instrument counts  were correct x2.  The patient went to the recovery room in stable condition.     Daleah Coulson L. Vincente PoliGrewal, M.D.     Florestine AversMLG/MEDQ  D:  05/16/2014  T:  05/17/2014  Job:  [id number]

## 2014-05-17 NOTE — Discharge Summary (Signed)
Admission Diagnosis: Chronic Pelvic Pain PCOS DM  Discharge diagnosis: Same  Hospital Course: 34 year old female with chronic pelvic pain and PCOS. Underwent TAH and BSO. She had a very good hospitalization. By POD #1 she was ambulating, voiding and had good pain control. She went home on POD #1.  She was given Rx Ibuprofen and Percocet for pain. She will follow up in 1 week She was given the usual discharge precautions.

## 2014-05-17 NOTE — Progress Notes (Signed)
Pt ambulated out teaching complete  

## 2014-05-17 NOTE — Progress Notes (Signed)
Patient is ambulating. Tolerating diet. Pain under good control. BP 120/70 mmHg  Pulse 85  Temp(Src) 99.6 F (37.6 C) (Oral)  Resp 18  Ht 5\' 6"  (1.676 m)  Wt 254 lb (115.214 kg)  BMI 41.02 kg/m2  SpO2 96% Results for orders placed or performed during the hospital encounter of 05/16/14 (from the past 24 hour(s))  Glucose, capillary     Status: Abnormal   Collection Time: 05/16/14  9:50 AM  Result Value Ref Range   Glucose-Capillary 282 (H) 70 - 99 mg/dL  Glucose, capillary     Status: Abnormal   Collection Time: 05/16/14 11:17 AM  Result Value Ref Range   Glucose-Capillary 272 (H) 70 - 99 mg/dL  Glucose, capillary     Status: Abnormal   Collection Time: 05/16/14 12:18 PM  Result Value Ref Range   Glucose-Capillary 283 (H) 70 - 99 mg/dL  Glucose, capillary     Status: Abnormal   Collection Time: 05/16/14  5:35 PM  Result Value Ref Range   Glucose-Capillary 244 (H) 70 - 99 mg/dL  Glucose, capillary     Status: Abnormal   Collection Time: 05/16/14  9:25 PM  Result Value Ref Range   Glucose-Capillary 184 (H) 70 - 99 mg/dL   Comment 1 Document in Chart   Basic metabolic panel     Status: Abnormal   Collection Time: 05/17/14  5:20 AM  Result Value Ref Range   Sodium 137 135 - 145 mmol/L   Potassium 3.7 3.5 - 5.1 mmol/L   Chloride 101 96 - 112 mmol/L   CO2 29 19 - 32 mmol/L   Glucose, Bld 227 (H) 70 - 99 mg/dL   BUN <5 (L) 6 - 23 mg/dL   Creatinine, Ser 1.910.40 (L) 0.50 - 1.10 mg/dL   Calcium 8.4 8.4 - 47.810.5 mg/dL   GFR calc non Af Amer >90 >90 mL/min   GFR calc Af Amer >90 >90 mL/min   Anion gap 7 5 - 15  CBC     Status: Abnormal   Collection Time: 05/17/14  5:20 AM  Result Value Ref Range   WBC 11.5 (H) 4.0 - 10.5 K/uL   RBC 3.94 3.87 - 5.11 MIL/uL   Hemoglobin 12.3 12.0 - 15.0 g/dL   HCT 29.536.0 62.136.0 - 30.846.0 %   MCV 91.4 78.0 - 100.0 fL   MCH 31.2 26.0 - 34.0 pg   MCHC 34.2 30.0 - 36.0 g/dL   RDW 65.712.8 84.611.5 - 96.215.5 %   Platelets 288 150 - 400 K/uL   General alert and  oriented Lung CTAB Car RRR Abdomen is soft and non tender Bandage removed - incision clean dry and intact  IMPRESSION: POD #1 Doing well Ambulating Advance to po pain medication Diabetes Mellitus Continue SSI And resume po meds

## 2014-12-09 ENCOUNTER — Ambulatory Visit
Admission: EM | Admit: 2014-12-09 | Discharge: 2014-12-09 | Disposition: A | Discharge status: Home / Self Care | Payer: 59 | Attending: Internal Medicine | Admitting: Internal Medicine

## 2014-12-09 DIAGNOSIS — J019 Acute sinusitis, unspecified: Secondary | ICD-10-CM

## 2014-12-09 LAB — RAPID STREP SCREEN (MED CTR MEBANE ONLY): Streptococcus, Group A Screen (Direct): NEGATIVE

## 2014-12-09 MED ORDER — CEFDINIR 300 MG PO CAPS: 300.0000 mg | ORAL_CAPSULE | Freq: Two times a day (BID) | ORAL | Status: AC

## 2014-12-09 MED ORDER — FLUTICASONE PROPIONATE 50 MCG/ACT NA SUSP: 1.0000 | Freq: Every day | NASAL | Status: AC

## 2014-12-09 NOTE — Discharge Instructions (Signed)
Anticipate gradual improvement over the next couple weeks. Recheck for increasing phlegm production, new fever >100.5, or if not starting to improve in the next few days. Prescriptions for omnicef (antibiotic) and flonase were printed today.

## 2014-12-09 NOTE — ED Notes (Signed)
Pt states "I have had the snots for 2 weeks, and my throat start hurting on Tuesday. I have coughed up yellow stuff today."

## 2014-12-09 NOTE — ED Provider Notes (Signed)
CSN: [id number]     Arrival date & time 12/09/14  1530 History   First MD Initiated Contact with Patient 12/09/14 1557     Chief Complaint  Patient presents with  . Sore Throat   HPI  Patient is a 34 year old lady who presents with runny nose/congestion for 2 weeks, cough productive of yellow phlegm. Bad sore throat a couple days ago, and then again yesterday. No fever, no headache. She reports that her nose is running constantly. No nausea/vomiting, slight decrease in appetite. No diarrhea. She is a little bit achy, but she also moved to this week. She does have a lot of cough, it's not severe but it is frequent, and coworkers sent her in for evaluation this afternoon.  Past Medical History  Diagnosis Date  . Hypertension   . Diabetes mellitus without complication   . Asthma     HX BRONCHITIS  . Vaginal delivery 2008    stillborn at 5 monthss   Past Surgical History  Procedure Laterality Date  . Bartholin cyst marsupialization    . Tonsillectomy    . Tonsillectomy and adenoidectomy  1989  . Abdominal hysterectomy N/A 05/16/2014    Procedure: HYSTERECTOMY ABDOMINAL;  Surgeon: Marcelle Overlie, MD;  Location: [facility];  Service: Gynecology;  Laterality: N/A;  . Salpingoophorectomy Bilateral 05/16/2014    Procedure: SALPINGO OOPHORECTOMY;  Surgeon: Marcelle Overlie, MD;  Location: [facility];  Service: Gynecology;  Laterality: Bilateral;    Social History  Substance Use Topics  . Smoking status: Former Smoker    Quit date: 05/10/2009  . Smokeless tobacco: Never Used  . Alcohol Use: No   OB History    No data available     Review of Systems  All other systems reviewed and are negative.   Allergies  Review of patient's allergies indicates no known allergies.  Home Medications   Prior to Admission medications   Medication Sig Start Date End Date Taking? Authorizing Provider  lisinopril-hydrochlorothiazide (PRINZIDE,ZESTORETIC) 10-12.5 MG per tablet Take 1 tablet by mouth daily.    Yes Historical Provider, MD  sertraline (ZOLOFT) 50 MG tablet Take 50 mg by mouth daily.   Yes Historical Provider, MD  SitaGLIPtin-MetFORMIN HCl [phone] MG TB24 Take 1 tablet by mouth daily.   Yes Historical Provider, MD  valACYclovir (VALTREX) 500 MG tablet Take 500 mg by mouth daily.   Yes Historical Provider, MD  budesonide-formoterol (SYMBICORT) 80-4.5 MCG/ACT inhaler Inhale 2 puffs into the lungs as needed (wheezing).    Historical Provider, MD                glipiZIDE (GLUCOTROL XL) 10 MG 24 hr tablet Take 10 mg by mouth daily with breakfast.    Historical Provider, MD  hyoscyamine (LEVSIN SL) 0.125 MG SL tablet Place 0.125 mg under the tongue daily.    Historical Provider, MD  ibuprofen (ADVIL,MOTRIN) 600 MG tablet Take 1 tablet (600 mg total) by mouth every 6 (six) hours as needed (mild pain). 05/17/14   Marcelle Overlie, MD  Omega-3 Fatty Acids (FISH OIL) 1000 MG CAPS Take 1 capsule by mouth daily.    Historical Provider, MD  omeprazole (PRILOSEC) 40 MG capsule Take 40 mg by mouth daily.    Historical Provider, MD  oxyCODONE-acetaminophen (PERCOCET/ROXICET) 5-325 MG per tablet Take 1-2 tablets by mouth every 4 (four) hours as needed for severe pain. 05/17/14   Marcelle Overlie, MD  vitamin C (ASCORBIC ACID) 500 MG tablet Take 500 mg by mouth daily.  Historical Provider, MD    BP 112/73 mmHg  Pulse 57  Temp(Src) 98.3 F (36.8 C) (Tympanic)  Resp 20  Ht  (1.702 m)  Wt 234 lb (106.142 kg)  BMI 36.64 kg/m2  SpO2 98%  LMP 09/11/2013 No data found.   Physical Exam  Constitutional: She is oriented to person, place, and time. No distress.  Alert, nicely groomed Sounds quite congested  HENT:  Head: Atraumatic.  Bilateral TMs moderately dull, no erythema Moderate nasal congestion Throat is difficult to visualize, but what is seen is red  Eyes:  Conjugate gaze, no eye redness/drainage  Neck: Neck supple.  Cardiovascular: Normal rate and regular rhythm.   Pulmonary/Chest: No  respiratory distress.  Coarse breath sounds throughout, symmetric breath sounds  Abdominal: She exhibits no distension.  Musculoskeletal: Normal range of motion.  No leg swelling  Neurological: She is alert and oriented to person, place, and time.  Skin: Skin is warm and dry.  Pink. No cyanosis  Nursing note and vitals reviewed.   ED Course  Procedures (including critical care time)  Labs Review Labs Reviewed  RAPID STREP SCREEN (NOT AT [facility])  CULTURE, GROUP A STREP ([facility] ONLY)     MDM   1. Acute sinusitis, recurrence not specified, unspecified location    Discharge Medication List as of 12/09/2014  4:17 PM    START taking these medications   Details  cefdinir (OMNICEF) 300 MG capsule Take 1 capsule (300 mg total) by mouth 2 (two) times daily., Starting 12/09/2014, Until Discontinued, Print       Rx as above. Recheck or follow up PCP for new fever greater than 100.5, increasing phlegm production, or if not starting to improve in a few days. Anticipate gradual improvement in cough over the next 2-3 weeks.    Eustace Moore, MD 12/09/14 [phone]

## 2014-12-12 LAB — CULTURE, GROUP A STREP (THRC)

## 2016-01-06 IMAGING — NM NM HEPATO W/GB/PHARM/[PERSON_NAME]
3 series · 13 of 13 positions shown · non-contrast
Comparison: Ultrasound July 18, 2010.

CLINICAL DATA: Nausea.

EXAM:
NUCLEAR MEDICINE HEPATOBILIARY IMAGING WITH GALLBLADDER EF
TECHNIQUE: Sequential images of the abdomen were obtained [DATE] minutes
following intravenous administration of radiopharmaceutical. After
slow intravenous infusion of 2.27 micrograms Cholecystokinin,
gallbladder ejection fraction was determined.
RADIOPHARMACEUTICALS:  5.0 Millicurie Nc-ZZm Choletec

[he hepatobiliary · 1 of 1 slices shown (1 of 3)]
[im 1/1]
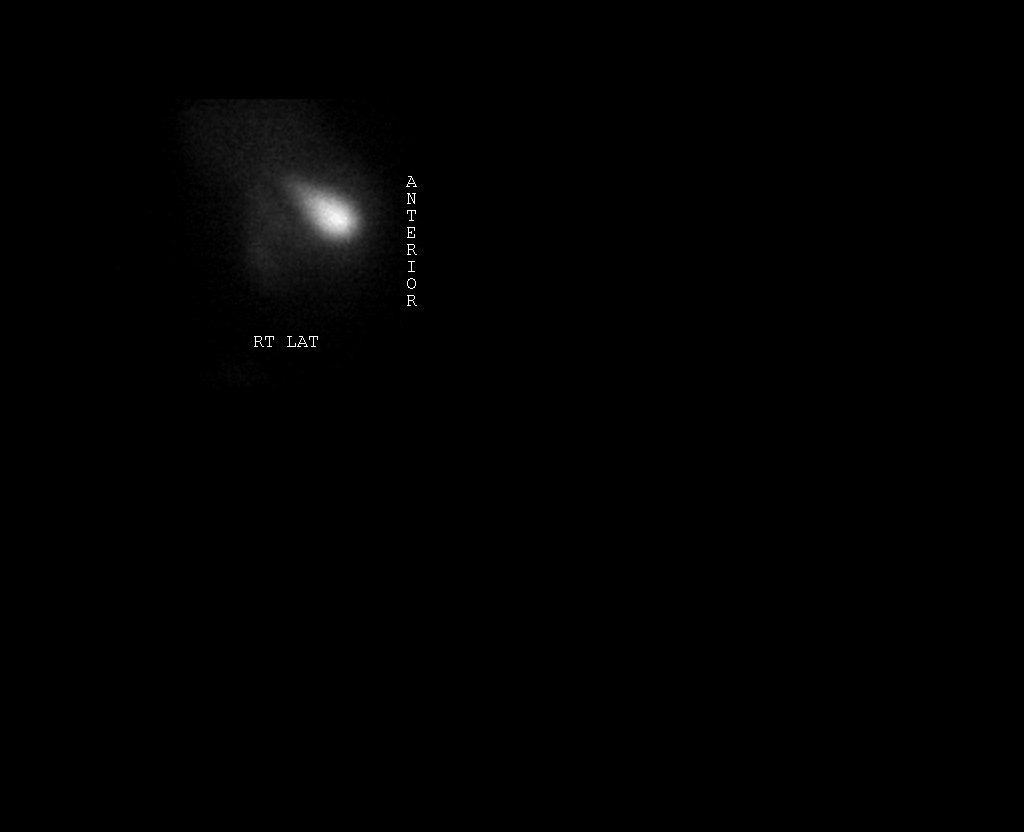

[he hepatobiliary · 3.43mm/px · 6 of 60 frames shown (2 of 3)]
[frame 6/60]
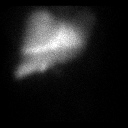
[frame 16/60]
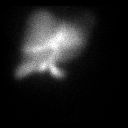
[frame 26/60]
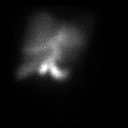
[frame 36/60]
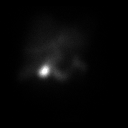
[frame 46/60]
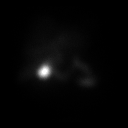
[frame 56/60]
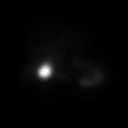

[he hepatobiliary · 3.43mm/px · 6 of 30 frames shown (3 of 3)]
[frame 3/30]
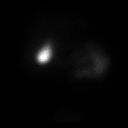
[frame 8/30]
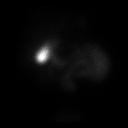
[frame 13/30]
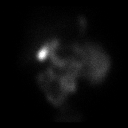
[frame 18/30]
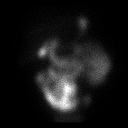
[frame 23/30]
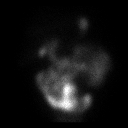
[frame 28/30]
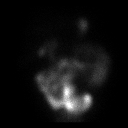

[13 of 13 positions shown; findings below may reference images not displayed]

FINDINGS: Normal uptake within hepatic parenchyma is noted. Normal and timely
filling of gallbladder is noted. Normal filling of small bowel is
noted. Gallbladder ejection fraction of 93.9% was measured after CCK
administration. At 30 min, normal ejection fraction is greater than
30%.

The patient did experience symptoms during CCK infusion.
IMPRESSION: Normal and timely uptake within gallbladder is noted. Normal
gallbladder ejection fraction is noted after CCK administration.
